# Patient Record
Sex: Female | Born: 1950 | Race: White | Hispanic: No | Smoking: Current every day smoker
Health system: Southern US, Community
[De-identification: ages and names within clinical notes are randomized; demographics above are authoritative.]

## PROBLEM LIST (undated history)

## (undated) DIAGNOSIS — C801 Malignant (primary) neoplasm, unspecified: Secondary | ICD-10-CM

## (undated) DIAGNOSIS — R569 Unspecified convulsions: Secondary | ICD-10-CM

## (undated) DIAGNOSIS — F32A Depression, unspecified: Secondary | ICD-10-CM

## (undated) DIAGNOSIS — B029 Zoster without complications: Secondary | ICD-10-CM

## (undated) DIAGNOSIS — F329 Major depressive disorder, single episode, unspecified: Secondary | ICD-10-CM

## (undated) DIAGNOSIS — J449 Chronic obstructive pulmonary disease, unspecified: Secondary | ICD-10-CM

## (undated) DIAGNOSIS — R062 Wheezing: Secondary | ICD-10-CM

## (undated) DIAGNOSIS — M810 Age-related osteoporosis without current pathological fracture: Secondary | ICD-10-CM

## (undated) HISTORY — PX: COLONOSCOPY: SHX174

---

## 2005-10-06 ENCOUNTER — Emergency Department: Payer: Self-pay | Admitting: Unknown Physician Specialty

## 2007-02-07 ENCOUNTER — Ambulatory Visit: Payer: Self-pay | Admitting: Family Medicine

## 2008-04-13 ENCOUNTER — Ambulatory Visit: Payer: Self-pay | Admitting: Family Medicine

## 2015-07-27 ENCOUNTER — Other Ambulatory Visit: Payer: Self-pay | Admitting: Family Medicine

## 2015-07-27 DIAGNOSIS — M810 Age-related osteoporosis without current pathological fracture: Secondary | ICD-10-CM

## 2015-08-01 ENCOUNTER — Ambulatory Visit
Admission: RE | Admit: 2015-08-01 | Discharge: 2015-08-01 | Disposition: A | Discharge status: Home / Self Care | Payer: Medicare Other | Source: Ambulatory Visit | Attending: Family Medicine | Admitting: Family Medicine

## 2015-08-01 ENCOUNTER — Ambulatory Visit: Payer: Self-pay

## 2015-08-01 DIAGNOSIS — J449 Chronic obstructive pulmonary disease, unspecified: Secondary | ICD-10-CM | POA: Insufficient documentation

## 2015-08-01 DIAGNOSIS — G40909 Epilepsy, unspecified, not intractable, without status epilepticus: Secondary | ICD-10-CM | POA: Insufficient documentation

## 2015-08-01 DIAGNOSIS — M810 Age-related osteoporosis without current pathological fracture: Secondary | ICD-10-CM | POA: Insufficient documentation

## 2015-08-01 DIAGNOSIS — Z780 Asymptomatic menopausal state: Secondary | ICD-10-CM | POA: Diagnosis not present

## 2015-08-09 ENCOUNTER — Ambulatory Visit
Admission: RE | Admit: 2015-08-09 | Discharge: 2015-08-09 | Disposition: A | Discharge status: Home / Self Care | Payer: Medicare Other | Source: Ambulatory Visit | Attending: Family Medicine | Admitting: Family Medicine

## 2015-08-09 ENCOUNTER — Other Ambulatory Visit: Payer: Self-pay | Admitting: Family Medicine

## 2015-08-09 DIAGNOSIS — M25562 Pain in left knee: Secondary | ICD-10-CM | POA: Diagnosis not present

## 2015-08-09 DIAGNOSIS — M25552 Pain in left hip: Secondary | ICD-10-CM

## 2015-08-09 DIAGNOSIS — M25572 Pain in left ankle and joints of left foot: Secondary | ICD-10-CM

## 2016-12-10 ENCOUNTER — Other Ambulatory Visit: Payer: Self-pay | Admitting: Family Medicine

## 2016-12-10 DIAGNOSIS — Z1231 Encounter for screening mammogram for malignant neoplasm of breast: Secondary | ICD-10-CM

## 2016-12-17 ENCOUNTER — Ambulatory Visit
Admission: RE | Admit: 2016-12-17 | Discharge: 2016-12-17 | Disposition: A | Discharge status: Home / Self Care | Payer: Medicare Other | Source: Ambulatory Visit | Attending: Family Medicine | Admitting: Family Medicine

## 2016-12-17 ENCOUNTER — Encounter: Payer: Self-pay | Admitting: Radiology

## 2016-12-17 DIAGNOSIS — Z1231 Encounter for screening mammogram for malignant neoplasm of breast: Secondary | ICD-10-CM | POA: Diagnosis present

## 2016-12-19 ENCOUNTER — Inpatient Hospital Stay
Admission: RE | Admit: 2016-12-19 | Discharge: 2016-12-19 | Disposition: A | Discharge status: Home / Self Care | Payer: Self-pay | Source: Ambulatory Visit | Attending: *Deleted | Admitting: *Deleted

## 2016-12-19 ENCOUNTER — Other Ambulatory Visit: Payer: Self-pay | Admitting: *Deleted

## 2016-12-19 DIAGNOSIS — Z9289 Personal history of other medical treatment: Secondary | ICD-10-CM

## 2017-09-12 ENCOUNTER — Other Ambulatory Visit: Payer: Self-pay | Admitting: Family Medicine

## 2017-09-12 DIAGNOSIS — Z780 Asymptomatic menopausal state: Secondary | ICD-10-CM

## 2017-09-24 ENCOUNTER — Inpatient Hospital Stay: Admission: RE | Admit: 2017-09-24 | Payer: Medicare Other | Source: Ambulatory Visit

## 2017-10-04 ENCOUNTER — Other Ambulatory Visit: Payer: Medicare Other

## 2017-10-09 ENCOUNTER — Inpatient Hospital Stay: Admission: RE | Admit: 2017-10-09 | Payer: Medicare Other | Source: Ambulatory Visit

## 2017-10-14 ENCOUNTER — Encounter (INDEPENDENT_AMBULATORY_CARE_PROVIDER_SITE_OTHER): Payer: Self-pay

## 2017-10-14 ENCOUNTER — Ambulatory Visit
Admission: RE | Admit: 2017-10-14 | Discharge: 2017-10-14 | Disposition: A | Discharge status: Home / Self Care | Payer: Medicare Other | Source: Ambulatory Visit | Attending: Family Medicine | Admitting: Family Medicine

## 2017-10-14 DIAGNOSIS — Z780 Asymptomatic menopausal state: Secondary | ICD-10-CM | POA: Insufficient documentation

## 2018-02-20 ENCOUNTER — Other Ambulatory Visit: Payer: Self-pay | Admitting: Family Medicine

## 2018-02-20 DIAGNOSIS — Z1231 Encounter for screening mammogram for malignant neoplasm of breast: Secondary | ICD-10-CM

## 2018-03-05 ENCOUNTER — Ambulatory Visit: Payer: Medicare Other

## 2018-03-25 ENCOUNTER — Encounter (INDEPENDENT_AMBULATORY_CARE_PROVIDER_SITE_OTHER): Payer: Self-pay

## 2018-03-25 ENCOUNTER — Ambulatory Visit
Admission: RE | Admit: 2018-03-25 | Discharge: 2018-03-25 | Disposition: A | Discharge status: Home / Self Care | Payer: Medicare Other | Source: Ambulatory Visit | Attending: Family Medicine | Admitting: Family Medicine

## 2018-03-25 DIAGNOSIS — Z1231 Encounter for screening mammogram for malignant neoplasm of breast: Secondary | ICD-10-CM | POA: Insufficient documentation

## 2018-03-25 HISTORY — DX: Malignant (primary) neoplasm, unspecified: C80.1

## 2018-06-11 ENCOUNTER — Encounter: Payer: Self-pay | Admitting: *Deleted

## 2018-06-17 ENCOUNTER — Ambulatory Visit: Admission: RE | Admit: 2018-06-17 | Payer: Medicare Other | Source: Home / Self Care | Admitting: Ophthalmology

## 2018-06-17 ENCOUNTER — Encounter: Admission: RE | Payer: Self-pay | Source: Home / Self Care

## 2018-06-17 HISTORY — DX: Age-related osteoporosis without current pathological fracture: M81.0

## 2018-06-17 HISTORY — DX: Depression, unspecified: F32.A

## 2018-06-17 HISTORY — DX: Zoster without complications: B02.9

## 2018-06-17 HISTORY — DX: Chronic obstructive pulmonary disease, unspecified: J44.9

## 2018-06-17 HISTORY — DX: Wheezing: R06.2

## 2018-06-17 HISTORY — DX: Major depressive disorder, single episode, unspecified: F32.9

## 2018-06-17 HISTORY — DX: Unspecified convulsions: R56.9

## 2018-06-17 SURGERY — PHACOEMULSIFICATION, CATARACT, WITH IOL INSERTION
Anesthesia: Choice | Laterality: Right

## 2018-07-08 ENCOUNTER — Encounter: Payer: Self-pay | Admitting: *Deleted

## 2018-07-08 ENCOUNTER — Ambulatory Visit: Payer: Medicare Other | Admitting: Certified Registered"

## 2018-07-08 ENCOUNTER — Other Ambulatory Visit: Payer: Self-pay

## 2018-07-08 ENCOUNTER — Ambulatory Visit
Admission: RE | Admit: 2018-07-08 | Discharge: 2018-07-08 | Disposition: A | Discharge status: Home / Self Care | Payer: Medicare Other | Attending: Ophthalmology | Admitting: Ophthalmology

## 2018-07-08 ENCOUNTER — Encounter
Admission: RE | Disposition: A | Discharge status: Home / Self Care | Payer: Self-pay | Source: Home / Self Care | Attending: Ophthalmology

## 2018-07-08 DIAGNOSIS — F17200 Nicotine dependence, unspecified, uncomplicated: Secondary | ICD-10-CM | POA: Insufficient documentation

## 2018-07-08 DIAGNOSIS — J449 Chronic obstructive pulmonary disease, unspecified: Secondary | ICD-10-CM | POA: Insufficient documentation

## 2018-07-08 DIAGNOSIS — I10 Essential (primary) hypertension: Secondary | ICD-10-CM | POA: Insufficient documentation

## 2018-07-08 DIAGNOSIS — F329 Major depressive disorder, single episode, unspecified: Secondary | ICD-10-CM | POA: Insufficient documentation

## 2018-07-08 DIAGNOSIS — H2511 Age-related nuclear cataract, right eye: Secondary | ICD-10-CM | POA: Diagnosis present

## 2018-07-08 DIAGNOSIS — R569 Unspecified convulsions: Secondary | ICD-10-CM | POA: Insufficient documentation

## 2018-07-08 DIAGNOSIS — Z79899 Other long term (current) drug therapy: Secondary | ICD-10-CM | POA: Insufficient documentation

## 2018-07-08 HISTORY — PX: CATARACT EXTRACTION W/PHACO: SHX586

## 2018-07-08 SURGERY — PHACOEMULSIFICATION, CATARACT, WITH IOL INSERTION
Anesthesia: Monitor Anesthesia Care | Site: Eye | Laterality: Right

## 2018-07-08 MED ORDER — CARBACHOL 0.01 % IO SOLN
INTRAOCULAR | Status: DC | PRN
  Administered 2018-07-08: 0.5 mL via INTRAOCULAR

## 2018-07-08 MED ORDER — MOXIFLOXACIN HCL 0.5 % OP SOLN
OPHTHALMIC | Status: AC
  Filled 2018-07-08: qty 3

## 2018-07-08 MED ORDER — FENTANYL CITRATE (PF) 100 MCG/2ML IJ SOLN
INTRAMUSCULAR | Status: AC
  Filled 2018-07-08: qty 2

## 2018-07-08 MED ORDER — NA CHONDROIT SULF-NA HYALURON 40-17 MG/ML IO SOLN
INTRAOCULAR | Status: DC | PRN
  Administered 2018-07-08: 1 mL via INTRAOCULAR

## 2018-07-08 MED ORDER — FENTANYL CITRATE (PF) 100 MCG/2ML IJ SOLN
INTRAMUSCULAR | Status: DC | PRN
  Administered 2018-07-08: 50 ug via INTRAVENOUS

## 2018-07-08 MED ORDER — TETRACAINE HCL 0.5 % OP SOLN
1.0000 [drp] | OPHTHALMIC | Status: AC | PRN
  Administered 2018-07-08 (×2): 1 [drp] via OPHTHALMIC

## 2018-07-08 MED ORDER — MOXIFLOXACIN HCL 0.5 % OP SOLN: 1.0000 [drp] | OPHTHALMIC | Status: DC | PRN

## 2018-07-08 MED ORDER — SODIUM CHLORIDE 0.9 % IV SOLN
INTRAVENOUS | Status: DC
  Administered 2018-07-08: 10:00:00 via INTRAVENOUS

## 2018-07-08 MED ORDER — EPINEPHRINE PF 1 MG/ML IJ SOLN
INTRAMUSCULAR | Status: AC
  Filled 2018-07-08: qty 2

## 2018-07-08 MED ORDER — MOXIFLOXACIN HCL 0.5 % OP SOLN
OPHTHALMIC | Status: DC | PRN
  Administered 2018-07-08: 0.2 mL via OPHTHALMIC

## 2018-07-08 MED ORDER — POVIDONE-IODINE 5 % OP SOLN
OPHTHALMIC | Status: DC | PRN
  Administered 2018-07-08: 1 via OPHTHALMIC

## 2018-07-08 MED ORDER — ARMC OPHTHALMIC DILATING DROPS
OPHTHALMIC | Status: AC
  Administered 2018-07-08: 1 via OPHTHALMIC
  Filled 2018-07-08: qty 0.5

## 2018-07-08 MED ORDER — ARMC OPHTHALMIC DILATING DROPS
1.0000 "application " | OPHTHALMIC | Status: AC
  Administered 2018-07-08 (×3): 1 via OPHTHALMIC

## 2018-07-08 MED ORDER — TETRACAINE HCL 0.5 % OP SOLN
OPHTHALMIC | Status: AC
  Administered 2018-07-08: 1 [drp] via OPHTHALMIC
  Filled 2018-07-08: qty 4

## 2018-07-08 MED ORDER — LIDOCAINE HCL (PF) 4 % IJ SOLN
INTRAOCULAR | Status: DC | PRN
  Administered 2018-07-08: 4 mL via OPHTHALMIC

## 2018-07-08 MED ORDER — LIDOCAINE HCL (PF) 4 % IJ SOLN
INTRAMUSCULAR | Status: AC
  Filled 2018-07-08: qty 5

## 2018-07-08 MED ORDER — POVIDONE-IODINE 5 % OP SOLN
OPHTHALMIC | Status: AC
  Filled 2018-07-08: qty 30

## 2018-07-08 MED ORDER — EPINEPHRINE PF 1 MG/ML IJ SOLN
INTRAOCULAR | Status: DC | PRN
  Administered 2018-07-08: 10:00:00 via OPHTHALMIC

## 2018-07-08 MED ORDER — NA CHONDROIT SULF-NA HYALURON 40-17 MG/ML IO SOLN
INTRAOCULAR | Status: AC
  Filled 2018-07-08: qty 1

## 2018-07-08 SURGICAL SUPPLY — 17 items
GLOVE BIO SURGEON STRL SZ8 (GLOVE) ×3 IMPLANT
GLOVE BIOGEL M 6.5 STRL (GLOVE) ×3 IMPLANT
GLOVE SURG LX 8.0 MICRO (GLOVE) ×2
GLOVE SURG LX STRL 8.0 MICRO (GLOVE) ×1 IMPLANT
GOWN STRL REUS W/ TWL LRG LVL3 (GOWN DISPOSABLE) ×2 IMPLANT
GOWN STRL REUS W/TWL LRG LVL3 (GOWN DISPOSABLE) ×4
LABEL CATARACT MEDS ST (LABEL) ×3 IMPLANT
LENS IOL ACRSF IQ PAN UV 24.0 IMPLANT
LENS IOL PANOPTIX RESTOR 24.0 ×3 IMPLANT
PACK CATARACT (MISCELLANEOUS) ×3 IMPLANT
PACK CATARACT BRASINGTON LX (MISCELLANEOUS) ×3 IMPLANT
PACK EYE AFTER SURG (MISCELLANEOUS) ×3 IMPLANT
SOL BSS BAG (MISCELLANEOUS) ×3
SOLUTION BSS BAG (MISCELLANEOUS) ×1 IMPLANT
SYR 5ML LL (SYRINGE) ×3 IMPLANT
WATER STERILE IRR 250ML POUR (IV SOLUTION) ×3 IMPLANT
WIPE NON LINTING 3.25X3.25 (MISCELLANEOUS) ×3 IMPLANT

## 2018-07-08 NOTE — Anesthesia Procedure Notes (Signed)
Procedure Name: MAC Performed by: Gilberto Streck, CRNA Pre-anesthesia Checklist: Patient identified, Emergency Drugs available, Suction available, Patient being monitored and Timeout performed Oxygen Delivery Method: Nasal cannula       

## 2018-07-08 NOTE — Anesthesia Post-op Follow-up Note (Signed)
Anesthesia QCDR form completed.        

## 2018-07-08 NOTE — Anesthesia Postprocedure Evaluation (Signed)
Anesthesia Post Note  Patient: Renee Johnston  Procedure(s) Performed: CATARACT EXTRACTION PHACO AND INTRAOCULAR LENS PLACEMENT ([place]), RIGHT (Right Eye)  Patient location during evaluation: PACU Anesthesia Type: MAC Level of consciousness: awake and alert and oriented Pain management: pain level controlled Vital Signs Assessment: post-procedure vital signs reviewed and stable Respiratory status: respiratory function stable Cardiovascular status: stable Anesthetic complications: no     Last Vitals:  Vitals:   07/08/18 0936 07/08/18 1038  BP: (!) 102/57 (!) 96/43  Pulse: 83 75  Resp: 18 16  Temp: (!) 36.3 C 36.8 C  SpO2: 96% 100%    Last Pain:  Vitals:   07/08/18 1038  TempSrc: Oral  PainSc: 0-No pain                 Blima Singer

## 2018-07-08 NOTE — Transfer of Care (Signed)
Immediate Anesthesia Transfer of Care Note  Patient: Renee Johnston  Procedure(s) Performed: CATARACT EXTRACTION PHACO AND INTRAOCULAR LENS PLACEMENT ([facility]), RIGHT (Right Eye)  Patient Location: PACU  Anesthesia Type:MAC  Level of Consciousness: awake, alert  and oriented  Airway & Oxygen Therapy: Patient Spontanous Breathing  Post-op Assessment: Report given to RN and Post -op Vital signs reviewed and stable  Post vital signs: Reviewed and stable  Last Vitals:  Vitals Value Taken Time  BP    Temp    Pulse    Resp    SpO2      Last Pain:  Vitals:   07/08/18 0936  TempSrc: Tympanic  PainSc: 0-No pain         Complications: No apparent anesthesia complications

## 2018-07-08 NOTE — H&P (Signed)
All labs reviewed. Abnormal studies sent to patients PCP when indicated.  Previous H&P reviewed, patient examined, there are NO CHANGES.  Hafiz Irion Porfilio3/17/202010:12 AM

## 2018-07-08 NOTE — Anesthesia Preprocedure Evaluation (Signed)
Anesthesia Evaluation  Patient identified by MRN, date of birth, ID band Patient awake    Reviewed: Allergy & Precautions, H&P , NPO status , Patient's Chart, lab work & pertinent test results, reviewed documented beta blocker date and time   Airway Mallampati: II  TM Distance: >3 FB Neck ROM: full    Dental no notable dental hx. (+) Teeth Intact   Pulmonary neg pulmonary ROS, COPD, Current Smoker,    Pulmonary exam normal breath sounds clear to auscultation       Cardiovascular Exercise Tolerance: Good hypertension, negative cardio ROS   Rhythm:regular Rate:Normal     Neuro/Psych Seizures -,  PSYCHIATRIC DISORDERS Depression negative neurological ROS  negative psych ROS   GI/Hepatic negative GI ROS, Neg liver ROS,   Endo/Other  negative endocrine ROSdiabetes  Renal/GU      Musculoskeletal   Abdominal   Peds  Hematology negative hematology ROS (+)   Anesthesia Other Findings   Reproductive/Obstetrics negative OB ROS                             Anesthesia Physical Anesthesia Plan  ASA: III  Anesthesia Plan: MAC   Post-op Pain Management:    Induction:   PONV Risk Score and Plan:   Airway Management Planned:   Additional Equipment:   Intra-op Plan:   Post-operative Plan:   Informed Consent: I have reviewed the patients History and Physical, chart, labs and discussed the procedure including the risks, benefits and alternatives for the proposed anesthesia with the patient or authorized representative who has indicated his/her understanding and acceptance.       Plan Discussed with: CRNA  Anesthesia Plan Comments:         Anesthesia Quick Evaluation

## 2018-07-08 NOTE — Op Note (Signed)
PREOPERATIVE DIAGNOSIS:  Nuclear sclerotic cataract of the right eye.   POSTOPERATIVE DIAGNOSIS:  NUCLEAR SCLEROTIC CATARACT RIGHT EYE   OPERATIVE PROCEDURE: Procedure(s): CATARACT EXTRACTION PHACO AND INTRAOCULAR LENS PLACEMENT ([place]), RIGHT   SURGEON:  Birder Robson, MD.   ANESTHESIA:  Anesthesiologist: Molli Barrows, MD CRNA: Demetrius Charity, CRNA  1.      Managed anesthesia care. 2.      0.65ml of Shugarcaine was instilled in the eye following the paracentesis.   COMPLICATIONS:  None.   TECHNIQUE:   Stop and chop   DESCRIPTION OF PROCEDURE:  The patient was examined and consented in the preoperative holding area where the aforementioned topical anesthesia was applied to the right eye and then brought back to the Operating Room where the right eye was prepped and draped in the usual sterile ophthalmic fashion and a lid speculum was placed. A paracentesis was created with the side port blade and the anterior chamber was filled with viscoelastic. A near clear corneal incision was performed with the steel keratome. A continuous curvilinear capsulorrhexis was performed with a cystotome followed by the capsulorrhexis forceps. Hydrodissection and hydrodelineation were carried out with BSS on a blunt cannula. The lens was removed in a stop and chop  technique and the remaining cortical material was removed with the irrigation-aspiration handpiece. The capsular bag was inflated with viscoelastic and the Technis [id number]  lens was placed in the capsular bag without complication. The remaining viscoelastic was removed from the eye with the irrigation-aspiration handpiece. The wounds were hydrated. The anterior chamber was flushed with Miostat and the eye was inflated to physiologic pressure. 0.67ml of Vigamox was placed in the anterior chamber. The wounds were found to be water tight. The eye was dressed with Vigamox. The patient was given protective glasses to wear throughout the day and a shield with  which to sleep tonight. The patient was also given drops with which to begin a drop regimen today and will follow-up with me in one day. Implant Name Type Inv. Item Serial No. Manufacturer Lot No. LRB No. Used  Intra Occular lens Acry Sof IQ PanOptix   [id number] ALCON  Right 1   Procedure(s) with comments: CATARACT EXTRACTION PHACO AND INTRAOCULAR LENS PLACEMENT ([facility]), RIGHT (Right) - [place] 00:25.4 CDE 3.94 Fluid Pack Lot # [id number]  Electronically signed: Birder Robson 07/08/2018 10:36 AM

## 2019-02-06 IMAGING — MG DIGITAL SCREENING BILATERAL MAMMOGRAM WITH TOMO AND CAD
8 series · 8 of 24 positions shown · non-contrast
Comparison: Previous exam(s).

CLINICAL DATA: Screening.

EXAM:
DIGITAL SCREENING BILATERAL MAMMOGRAM WITH TOMO AND CAD

[R CC synth-2D]
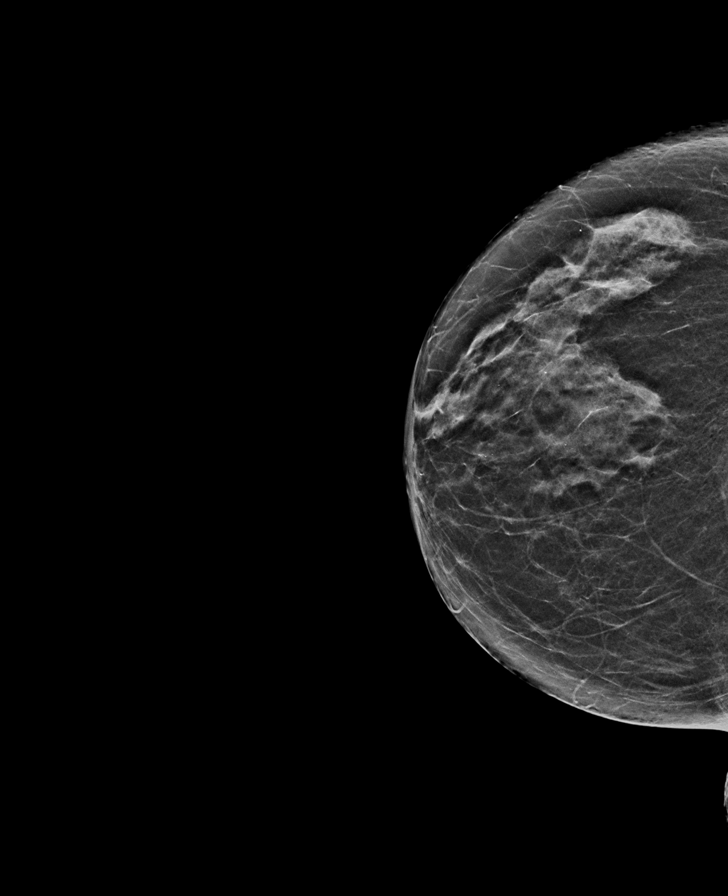

[L CC synth-2D]
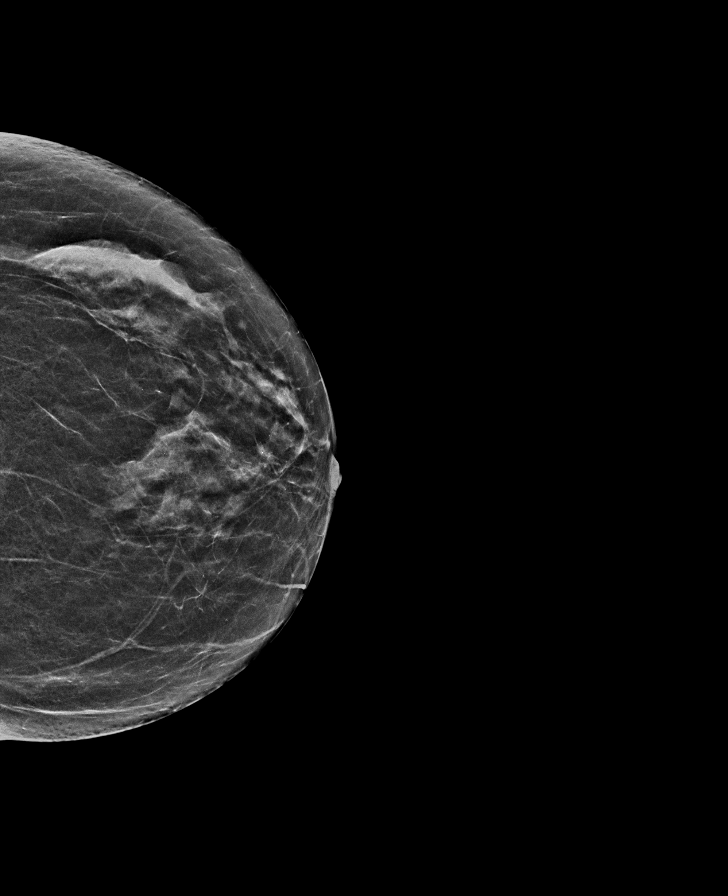

[L MLO synth-2D]
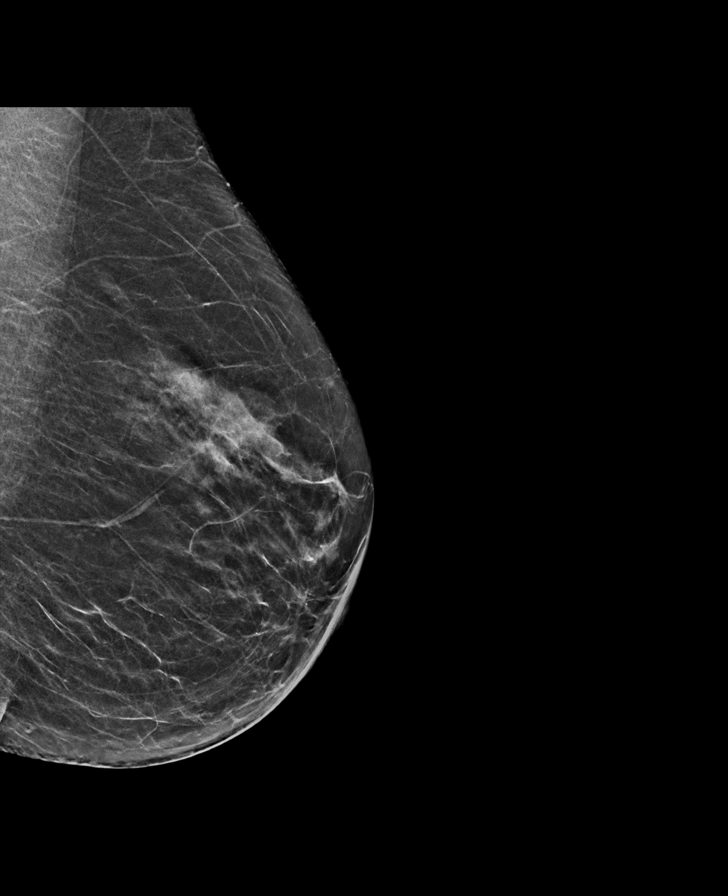

[R MLO synth-2D]
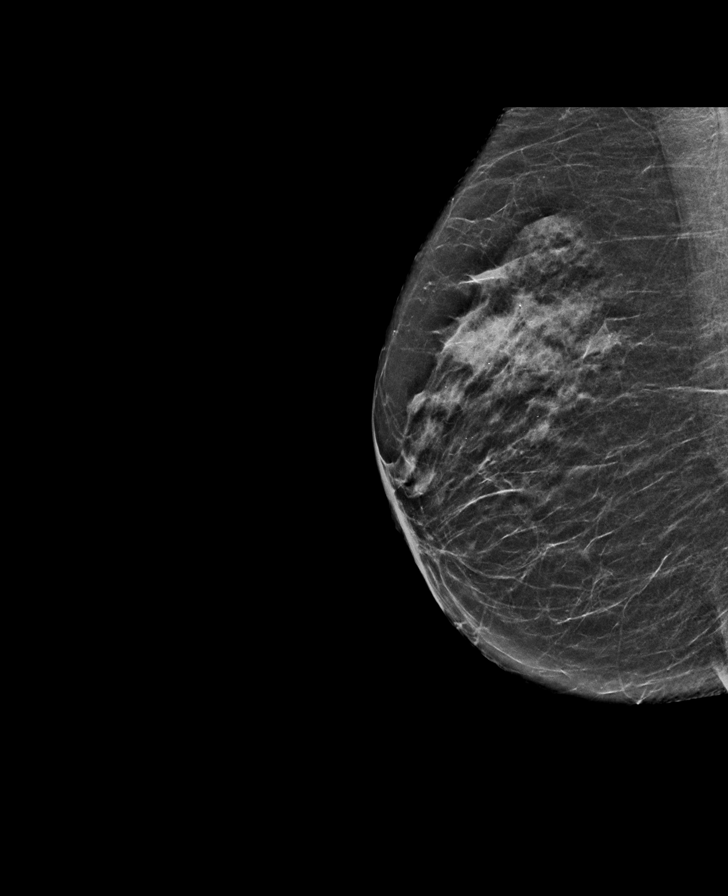

[L CC tomo · tomo slice 25/49.0]
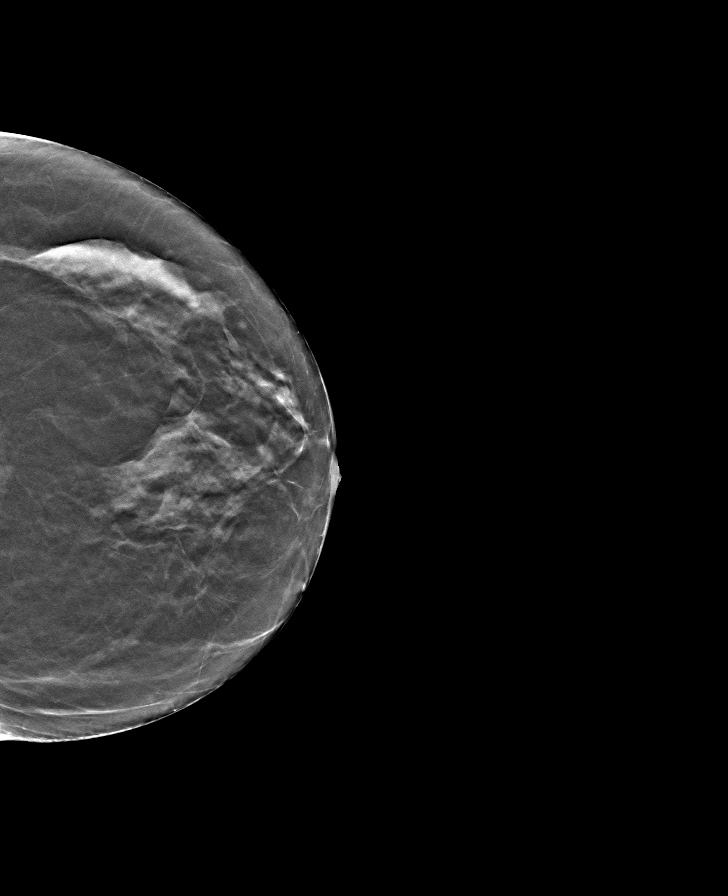

[R MLO tomo · tomo slice 25/49.0]
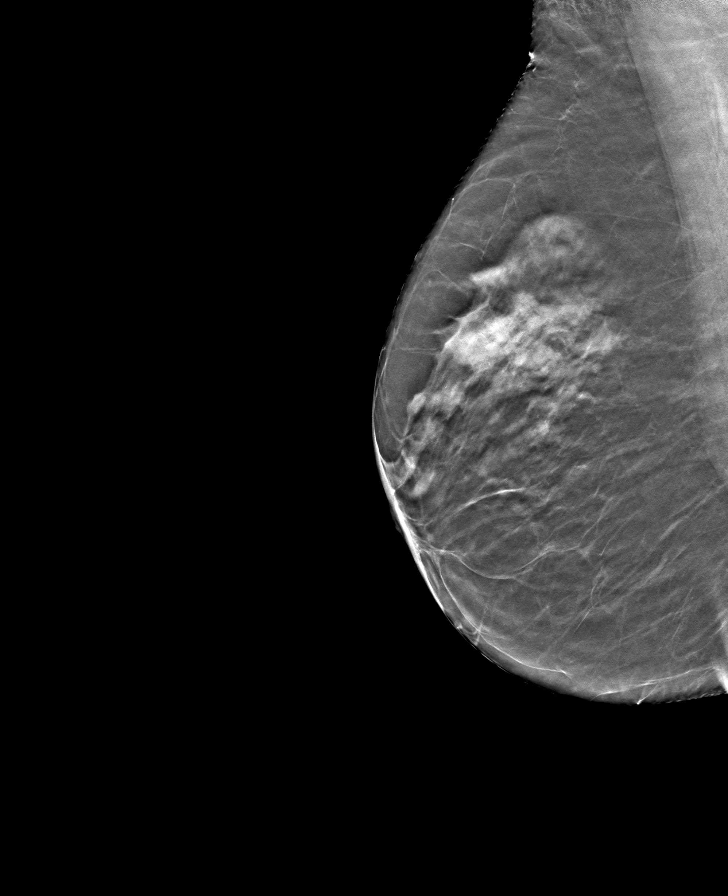

[R CC tomo · tomo slice 25/49.0]
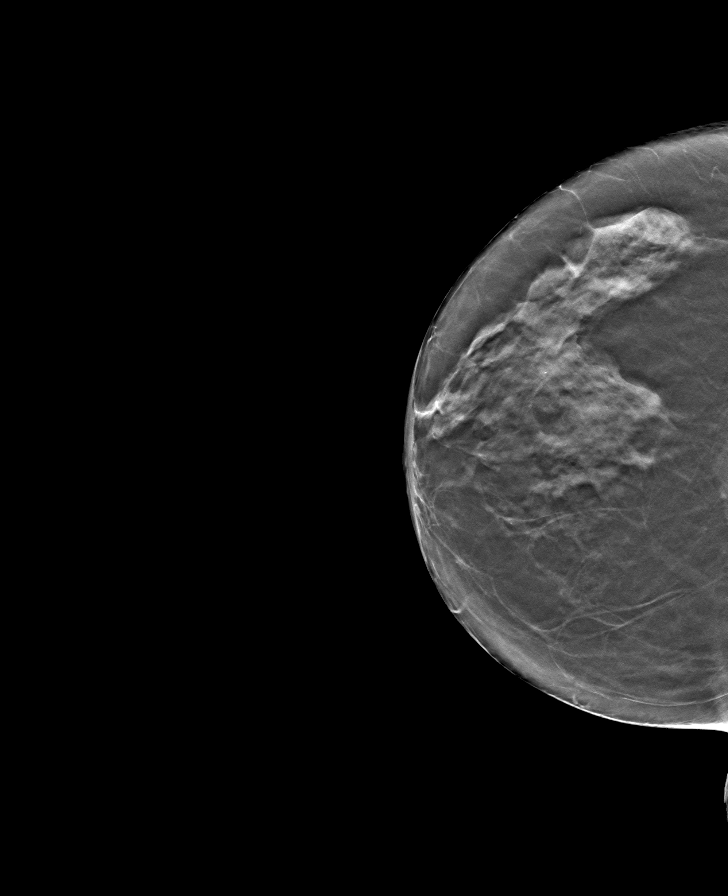

[L MLO tomo · tomo slice 27/52.0]
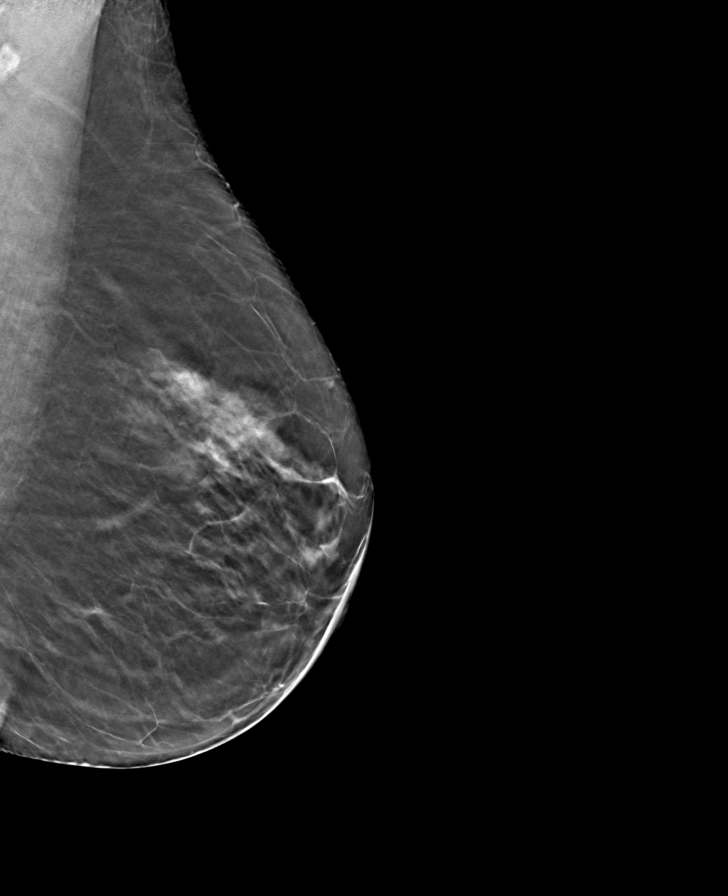

[8 of 24 positions shown; findings below may reference images not displayed]

ACR Breast Density Category c: The breast tissue is heterogeneously
dense, which may obscure small masses.
FINDINGS: There are no findings suspicious for malignancy. Images were
processed with CAD.
IMPRESSION: No mammographic evidence of malignancy. A result letter of this
screening mammogram will be mailed directly to the patient.

RECOMMENDATION:
Screening mammogram in one year. (Code:[id number])

BI-RADS CATEGORY  1: Negative.

## 2019-02-17 ENCOUNTER — Encounter: Payer: Self-pay | Admitting: Ophthalmology

## 2021-08-21 ENCOUNTER — Other Ambulatory Visit: Payer: Self-pay | Admitting: *Deleted

## 2021-08-21 DIAGNOSIS — Z122 Encounter for screening for malignant neoplasm of respiratory organs: Secondary | ICD-10-CM

## 2021-08-21 DIAGNOSIS — Z87891 Personal history of nicotine dependence: Secondary | ICD-10-CM

## 2021-08-21 DIAGNOSIS — F17210 Nicotine dependence, cigarettes, uncomplicated: Secondary | ICD-10-CM

## 2021-09-04 ENCOUNTER — Other Ambulatory Visit: Payer: Self-pay | Admitting: Family Medicine

## 2021-09-04 ENCOUNTER — Ambulatory Visit
Admission: RE | Admit: 2021-09-04 | Discharge: 2021-09-04 | Disposition: A | Discharge status: Home / Self Care | Payer: Medicare Other | Attending: Family Medicine | Admitting: Family Medicine

## 2021-09-04 ENCOUNTER — Ambulatory Visit
Admission: RE | Admit: 2021-09-04 | Discharge: 2021-09-04 | Disposition: A | Discharge status: Home / Self Care | Payer: Medicare Other | Source: Ambulatory Visit | Attending: Family Medicine | Admitting: Family Medicine

## 2021-09-04 DIAGNOSIS — W19XXXA Unspecified fall, initial encounter: Secondary | ICD-10-CM | POA: Diagnosis not present

## 2021-09-04 DIAGNOSIS — M25561 Pain in right knee: Secondary | ICD-10-CM | POA: Insufficient documentation

## 2021-09-06 ENCOUNTER — Ambulatory Visit (INDEPENDENT_AMBULATORY_CARE_PROVIDER_SITE_OTHER): Payer: Medicare Other | Admitting: Acute Care

## 2021-09-06 ENCOUNTER — Encounter: Payer: Self-pay | Admitting: Acute Care

## 2021-09-06 ENCOUNTER — Ambulatory Visit: Admission: RE | Admit: 2021-09-06 | Payer: Medicare Other | Source: Ambulatory Visit

## 2021-09-06 DIAGNOSIS — F17210 Nicotine dependence, cigarettes, uncomplicated: Secondary | ICD-10-CM

## 2021-09-06 NOTE — Progress Notes (Signed)
Virtual Visit via Telephone Note ? ?I connected with Renee Johnston on 09/06/21 at 11:00 AM EDT by telephone and verified that I am speaking with the correct person using two identifiers. ? ?Location: ?Patient:  At home ?[address], [place], [place], [address]  ?  ?I discussed the limitations, risks, security and privacy concerns of performing an evaluation and management service by telephone and the availability of in person appointments. I also discussed with the patient that there may be a patient responsible charge related to this service. The patient expressed understanding and agreed to proceed. ? ? ?Shared Decision Making Visit Lung Cancer Screening Program ?([phone]) ? ? ?Eligibility: ?Age 68 y.o. ?Pack Years Smoking History Calculation 20 pack year smoking history ?(# packs/per year x # years smoked) ?Recent History of coughing up blood  no ?Unexplained weight loss? no ?( >Than 15 pounds within the last 6 months ) ?Prior History Lung / other cancer no ?(Diagnosis within the last 5 years already requiring surveillance chest CT Scans). ?Smoking Status Current Smoker ?Former Smokers: Years since quit:  NA ? Quit Date:  NA ? ?Visit Components: ?Discussion included one or more decision making aids. yes ?Discussion included risk/benefits of screening. yes ?Discussion included potential follow up diagnostic testing for abnormal scans. yes ?Discussion included meaning and risk of over diagnosis. yes ?Discussion included meaning and risk of False Positives. yes ?Discussion included meaning of total radiation exposure. yes ? ?Counseling Included: ?Importance of adherence to annual lung cancer LDCT screening. yes ?Impact of comorbidities on ability to participate in the program. yes ?Ability and willingness to under diagnostic treatment. yes ? ?Smoking Cessation Counseling: ?Current Smokers:  ?Discussed importance of smoking cessation. yes ?Information about tobacco cessation classes and interventions  provided to patient. yes ?Patient provided with "ticket" for LDCT Scan. yes ?Symptomatic Patient. no ? Counseling NA ?Diagnosis Code: Tobacco Use Z72.0 ?Asymptomatic Patient yes ? Counseling (Intermediate counseling: > three minutes counseling) X4801 ?Former Smokers:  ?Discussed the importance of maintaining cigarette abstinence. yes ?Diagnosis Code: Personal History of Nicotine Dependence. K55.374 ?Information about tobacco cessation classes and interventions provided to patient. Yes ?Patient provided with "ticket" for LDCT Scan. yes ?Written Order for Lung Cancer Screening with LDCT placed in Epic. Yes ?(CT Chest Lung Cancer Screening Low Dose W/O CM) [id number] ?Z12.2-Screening of respiratory organs ?Z87.891-Personal history of nicotine dependence ? ?I have spent 25 minutes of face to face/ virtual visit   time with  [name] discussing the risks and benefits of lung cancer screening. We viewed / discussed a power point together that explained in detail the above noted topics. We paused at intervals to allow for questions to be asked and answered to ensure understanding.We discussed that the single most powerful action that she can take to decrease her risk of developing lung cancer is to quit smoking. We discussed whether or not she is ready to commit to setting a quit date. We discussed options for tools to aid in quitting smoking including nicotine replacement therapy, non-nicotine medications, support groups, Quit Smart classes, and behavior modification. We discussed that often times setting smaller, more achievable goals, such as eliminating 1 cigarette a day for a week and then 2 cigarettes a day for a week can be helpful in slowly decreasing the number of cigarettes smoked. This allows for a sense of accomplishment as well as providing a clinical benefit. I provided  her  with smoking cessation  information  with contact information for community resources, classes, free nicotine  replacement therapy, and  access to mobile apps, text messaging, and on-line smoking cessation help. I have also provided  her  the office contact information in the event she needs to contact me, or the screening staff. We discussed the time and location of the scan, and that either Doroteo Glassman RN, Joella Prince, RN  or I will call / send a letter with the results within 24-72 hours of receiving them. The patient verbalized understanding of all of  the above and had no further questions upon leaving the office. They have my contact information in the event they have any further questions. ? ?I spent 3 minutes counseling on smoking cessation and the health risks of continued tobacco abuse. ? ?I explained to the patient that there has been a high incidence of coronary artery disease noted on these exams. I explained that this is a non-gated exam therefore degree or severity cannot be determined. This patient is on statin therapy. I have asked the patient to follow-up with their PCP regarding any incidental finding of coronary artery disease and management with diet or medication as their PCP  feels is clinically indicated. The patient verbalized understanding of the above and had no further questions upon completion of the visit. ? ?  ? ? ?Magdalen Spatz, NP ?09/06/2021 ? ? ? ? ? ? ?

## 2021-09-06 NOTE — Patient Instructions (Signed)
Thank you for participating in the [facility]. It was our pleasure to meet you today. We will call you with the results of your scan within the next few days. Your scan will be assigned a Lung RADS category score by the physicians reading the scans.  This Lung RADS score determines follow up scanning.  See below for description of categories, and follow up screening recommendations. We will be in touch to schedule your follow up screening annually or based on recommendations of our providers. We will fax a copy of your scan results to your Primary Care Physician, or the physician who referred you to the program, to ensure they have the results. Please call the office if you have any questions or concerns regarding your scanning experience or results.  Our office number is [phone]. Please speak with Denise Phelps, RN. , or  Denise Buckner RN, They are  our Lung Cancer Screening RN.'s If They are unavailable when you call, Please leave a message on the voice mail. We will return your call at our earliest convenience.This voice mail is monitored several times a day.  Remember, if your scan is normal, we will scan you annually as long as you continue to meet the criteria for the program. (Age 55-77, Current smoker or smoker who has quit within the last 15 years). If you are a smoker, remember, quitting is the single most powerful action that you can take to decrease your risk of lung cancer and other pulmonary, breathing related problems. We know quitting is hard, and we are here to help.  Please let us know if there is anything we can do to help you meet your goal of quitting. If you are a former smoker, congratulations. We are proud of you! Remain smoke free! Remember you can refer friends or family members through the number above.  We will screen them to make sure they meet criteria for the program. Thank you for helping us take better care of you by  participating in Lung Screening.  You can receive free nicotine replacement therapy ( patches, gum or mints) by calling 1-800-QUIT NOW. Please call so we can get you on the path to becoming  a non-smoker. I know it is hard, but you can do this!  Lung RADS Categories:  Lung RADS 1: no nodules or definitely non-concerning nodules.  Recommendation is for a repeat annual scan in 12 months.  Lung RADS 2:  nodules that are non-concerning in appearance and behavior with a very low likelihood of becoming an active cancer. Recommendation is for a repeat annual scan in 12 months.  Lung RADS 3: nodules that are probably non-concerning , includes nodules with a low likelihood of becoming an active cancer.  Recommendation is for a 6-month repeat screening scan. Often noted after an upper respiratory illness. We will be in touch to make sure you have no questions, and to schedule your 6-month scan.  Lung RADS 4 A: nodules with concerning findings, recommendation is most often for a follow up scan in 3 months or additional testing based on our provider's assessment of the scan. We will be in touch to make sure you have no questions and to schedule the recommended 3 month follow up scan.  Lung RADS 4 B:  indicates findings that are concerning. We will be in touch with you to schedule additional diagnostic testing based on our provider's  assessment of the scan.  Other options for assistance in smoking cessation (   As covered by your insurance benefits)  Hypnosis for smoking cessation  [employer] [phone]  Acupuncture for smoking cessation  [facility] [phone]   

## 2021-09-11 ENCOUNTER — Ambulatory Visit: Admission: RE | Admit: 2021-09-11 | Payer: Medicare Other | Source: Ambulatory Visit

## 2021-09-15 ENCOUNTER — Ambulatory Visit
Admission: RE | Admit: 2021-09-15 | Discharge: 2021-09-15 | Disposition: A | Discharge status: Home / Self Care | Payer: Medicare Other | Source: Ambulatory Visit | Attending: Acute Care | Admitting: Acute Care

## 2021-09-15 DIAGNOSIS — Z87891 Personal history of nicotine dependence: Secondary | ICD-10-CM | POA: Insufficient documentation

## 2021-09-15 DIAGNOSIS — F17210 Nicotine dependence, cigarettes, uncomplicated: Secondary | ICD-10-CM | POA: Diagnosis present

## 2021-09-15 DIAGNOSIS — Z122 Encounter for screening for malignant neoplasm of respiratory organs: Secondary | ICD-10-CM | POA: Insufficient documentation

## 2021-09-19 ENCOUNTER — Other Ambulatory Visit: Payer: Self-pay | Admitting: Acute Care

## 2021-09-19 DIAGNOSIS — Z122 Encounter for screening for malignant neoplasm of respiratory organs: Secondary | ICD-10-CM

## 2021-09-19 DIAGNOSIS — Z87891 Personal history of nicotine dependence: Secondary | ICD-10-CM

## 2021-09-19 DIAGNOSIS — F17210 Nicotine dependence, cigarettes, uncomplicated: Secondary | ICD-10-CM

## 2022-04-30 ENCOUNTER — Other Ambulatory Visit: Payer: Self-pay | Admitting: Family Medicine

## 2022-04-30 DIAGNOSIS — Z1231 Encounter for screening mammogram for malignant neoplasm of breast: Secondary | ICD-10-CM

## 2022-04-30 DIAGNOSIS — Z780 Asymptomatic menopausal state: Secondary | ICD-10-CM

## 2022-05-01 ENCOUNTER — Ambulatory Visit
Admission: RE | Admit: 2022-05-01 | Discharge: 2022-05-01 | Disposition: A | Discharge status: Home / Self Care | Payer: Medicare Other | Source: Ambulatory Visit | Attending: Family Medicine | Admitting: Family Medicine

## 2022-05-01 DIAGNOSIS — Z1231 Encounter for screening mammogram for malignant neoplasm of breast: Secondary | ICD-10-CM | POA: Insufficient documentation

## 2022-05-01 DIAGNOSIS — Z780 Asymptomatic menopausal state: Secondary | ICD-10-CM

## 2022-09-18 ENCOUNTER — Other Ambulatory Visit: Payer: Self-pay | Admitting: Emergency Medicine

## 2022-09-18 ENCOUNTER — Ambulatory Visit: Payer: Medicare Other

## 2022-09-18 DIAGNOSIS — Z122 Encounter for screening for malignant neoplasm of respiratory organs: Secondary | ICD-10-CM

## 2022-09-18 DIAGNOSIS — F17210 Nicotine dependence, cigarettes, uncomplicated: Secondary | ICD-10-CM

## 2022-09-18 DIAGNOSIS — Z87891 Personal history of nicotine dependence: Secondary | ICD-10-CM

## 2022-09-21 ENCOUNTER — Ambulatory Visit
Admission: RE | Admit: 2022-09-21 | Discharge: 2022-09-21 | Disposition: A | Discharge status: Home / Self Care | Payer: Medicare Other | Source: Ambulatory Visit | Attending: Acute Care | Admitting: Acute Care

## 2022-09-21 DIAGNOSIS — Z122 Encounter for screening for malignant neoplasm of respiratory organs: Secondary | ICD-10-CM | POA: Diagnosis present

## 2022-09-21 DIAGNOSIS — Z87891 Personal history of nicotine dependence: Secondary | ICD-10-CM | POA: Diagnosis present

## 2022-09-21 DIAGNOSIS — F17210 Nicotine dependence, cigarettes, uncomplicated: Secondary | ICD-10-CM | POA: Insufficient documentation

## 2022-09-28 ENCOUNTER — Other Ambulatory Visit: Payer: Self-pay

## 2022-09-28 DIAGNOSIS — Z122 Encounter for screening for malignant neoplasm of respiratory organs: Secondary | ICD-10-CM

## 2022-09-28 DIAGNOSIS — Z87891 Personal history of nicotine dependence: Secondary | ICD-10-CM

## 2022-09-28 DIAGNOSIS — F17210 Nicotine dependence, cigarettes, uncomplicated: Secondary | ICD-10-CM

## 2023-04-04 ENCOUNTER — Other Ambulatory Visit: Payer: Self-pay | Admitting: Family Medicine

## 2023-04-04 DIAGNOSIS — Z1231 Encounter for screening mammogram for malignant neoplasm of breast: Secondary | ICD-10-CM

## 2023-05-08 ENCOUNTER — Ambulatory Visit
Admission: RE | Admit: 2023-05-08 | Discharge: 2023-05-08 | Disposition: A | Discharge status: Home / Self Care | Payer: Medicare Other | Source: Ambulatory Visit | Attending: Family Medicine | Admitting: Family Medicine

## 2023-05-08 DIAGNOSIS — Z1231 Encounter for screening mammogram for malignant neoplasm of breast: Secondary | ICD-10-CM | POA: Diagnosis present

## 2023-08-13 LAB — EXTERNAL GENERIC LAB PROCEDURE: COLOGUARD: NEGATIVE

## 2023-08-13 LAB — COLOGUARD: COLOGUARD: NEGATIVE

## 2023-09-23 ENCOUNTER — Ambulatory Visit
Admission: RE | Admit: 2023-09-23 | Discharge: 2023-09-23 | Disposition: A | Discharge status: Home / Self Care | Source: Ambulatory Visit | Attending: Acute Care | Admitting: Acute Care

## 2023-09-23 DIAGNOSIS — F17210 Nicotine dependence, cigarettes, uncomplicated: Secondary | ICD-10-CM | POA: Diagnosis present

## 2023-09-23 DIAGNOSIS — Z122 Encounter for screening for malignant neoplasm of respiratory organs: Secondary | ICD-10-CM | POA: Insufficient documentation

## 2023-09-23 DIAGNOSIS — Z87891 Personal history of nicotine dependence: Secondary | ICD-10-CM | POA: Diagnosis present

## 2023-10-10 ENCOUNTER — Telehealth: Payer: Self-pay

## 2023-10-10 ENCOUNTER — Other Ambulatory Visit: Payer: Self-pay

## 2023-10-10 DIAGNOSIS — Z87891 Personal history of nicotine dependence: Secondary | ICD-10-CM

## 2023-10-10 DIAGNOSIS — F17210 Nicotine dependence, cigarettes, uncomplicated: Secondary | ICD-10-CM

## 2023-10-10 DIAGNOSIS — Z122 Encounter for screening for malignant neoplasm of respiratory organs: Secondary | ICD-10-CM

## 2023-10-10 DIAGNOSIS — R911 Solitary pulmonary nodule: Secondary | ICD-10-CM

## 2023-10-10 NOTE — Telephone Encounter (Signed)
 Spoke with patient by phone, using two patient identifiers, to review results of LDCT.  Emphysema and atherosclerosis, as previously noted.  New lung nodules with recommendation to repeat LDCT in 6 months instead of waiting one year.  Nodules likely benign.  Patient has not had any increase in congestion or other symptoms of illness recently. She is in agreement with plan to repeat the CT and she elected to go ahead and make her appointment. She had no questions.  Results faxed to PCP

## 2023-10-10 NOTE — Telephone Encounter (Signed)
 LVM for patient to call office and review recent Lung CT results. She has a new 4.7 mm and 3.8 mm nodule.   MPRESSION: 1. Lung-RADS 3, probably benign findings. Short-term follow-up in 6 months is recommended for the posteromedial left upper lobe nodule with repeat low-dose chest CT without contrast (please use the following order, CT CHEST LCS NODULE FOLLOW-UP W/O CM). 2. Aortic Atherosclerosis (ICD10-I70.0) and Emphysema (ICD10-J43.9). Coronary artery calcifications. Assessment for potential risk factor modification, dietary therapy or pharmacologic therapy may be warranted, if clinically indicated.

## 2024-03-11 ENCOUNTER — Other Ambulatory Visit: Payer: Self-pay | Admitting: Specialist

## 2024-03-11 DIAGNOSIS — R918 Other nonspecific abnormal finding of lung field: Secondary | ICD-10-CM

## 2024-03-11 DIAGNOSIS — F17210 Nicotine dependence, cigarettes, uncomplicated: Secondary | ICD-10-CM

## 2024-03-24 ENCOUNTER — Ambulatory Visit: Admission: RE | Admit: 2024-03-24 | Source: Ambulatory Visit

## 2024-03-24 ENCOUNTER — Ambulatory Visit
Admission: RE | Admit: 2024-03-24 | Discharge: 2024-03-24 | Disposition: A | Discharge status: Home / Self Care | Source: Ambulatory Visit | Attending: Specialist | Admitting: Specialist

## 2024-03-24 DIAGNOSIS — R918 Other nonspecific abnormal finding of lung field: Secondary | ICD-10-CM | POA: Diagnosis present

## 2024-03-24 DIAGNOSIS — F17210 Nicotine dependence, cigarettes, uncomplicated: Secondary | ICD-10-CM | POA: Insufficient documentation
